# Patient Record
Sex: Female | Born: 1980 | Race: White | Hispanic: No | Marital: Married | Smoking: Former smoker
Health system: Southern US, Community
[De-identification: ages and names within clinical notes are randomized; demographics above are authoritative.]

## PROBLEM LIST (undated history)

## (undated) DIAGNOSIS — I10 Essential (primary) hypertension: Secondary | ICD-10-CM

## (undated) DIAGNOSIS — J45909 Unspecified asthma, uncomplicated: Secondary | ICD-10-CM

---

## 2004-09-14 ENCOUNTER — Ambulatory Visit: Payer: Self-pay

## 2004-11-16 ENCOUNTER — Ambulatory Visit: Payer: Self-pay

## 2005-03-04 ENCOUNTER — Observation Stay: Payer: Self-pay | Admitting: Obstetrics and Gynecology

## 2005-03-05 ENCOUNTER — Observation Stay: Payer: Self-pay | Admitting: Obstetrics and Gynecology

## 2005-03-05 ENCOUNTER — Inpatient Hospital Stay: Payer: Self-pay | Admitting: Obstetrics and Gynecology

## 2007-03-06 ENCOUNTER — Other Ambulatory Visit: Payer: Self-pay

## 2007-03-06 ENCOUNTER — Emergency Department: Payer: Self-pay | Admitting: Emergency Medicine

## 2007-03-08 ENCOUNTER — Emergency Department: Payer: Self-pay | Admitting: Emergency Medicine
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
## 2010-07-27 ENCOUNTER — Emergency Department: Payer: Self-pay | Admitting: Emergency Medicine

## 2010-08-19 ENCOUNTER — Encounter: Admission: RE | Admit: 2010-08-19 | Discharge: 2010-08-19 | Payer: Self-pay | Admitting: Diagnostic Neuroimaging

## 2010-11-07 ENCOUNTER — Emergency Department: Payer: Self-pay | Admitting: Emergency Medicine

## 2011-10-18 IMAGING — CR DG CHEST 2V
1 series · 2 of 2 positions shown · non-contrast
Comparison: none

REASON FOR EXAM: COUGH
COMMENTS:

PROCEDURE:     DXR - DXR CHEST PA (OR AP) AND LATERAL  - November 07, 2010  [DATE]
RESULT:     The lungs are clear. The cardiac silhouette and visualized bony
skeleton are unremarkable.

[Series 1: view not recorded · 0.17mm/px · 2 of 2 slices shown]
[im 1/2]
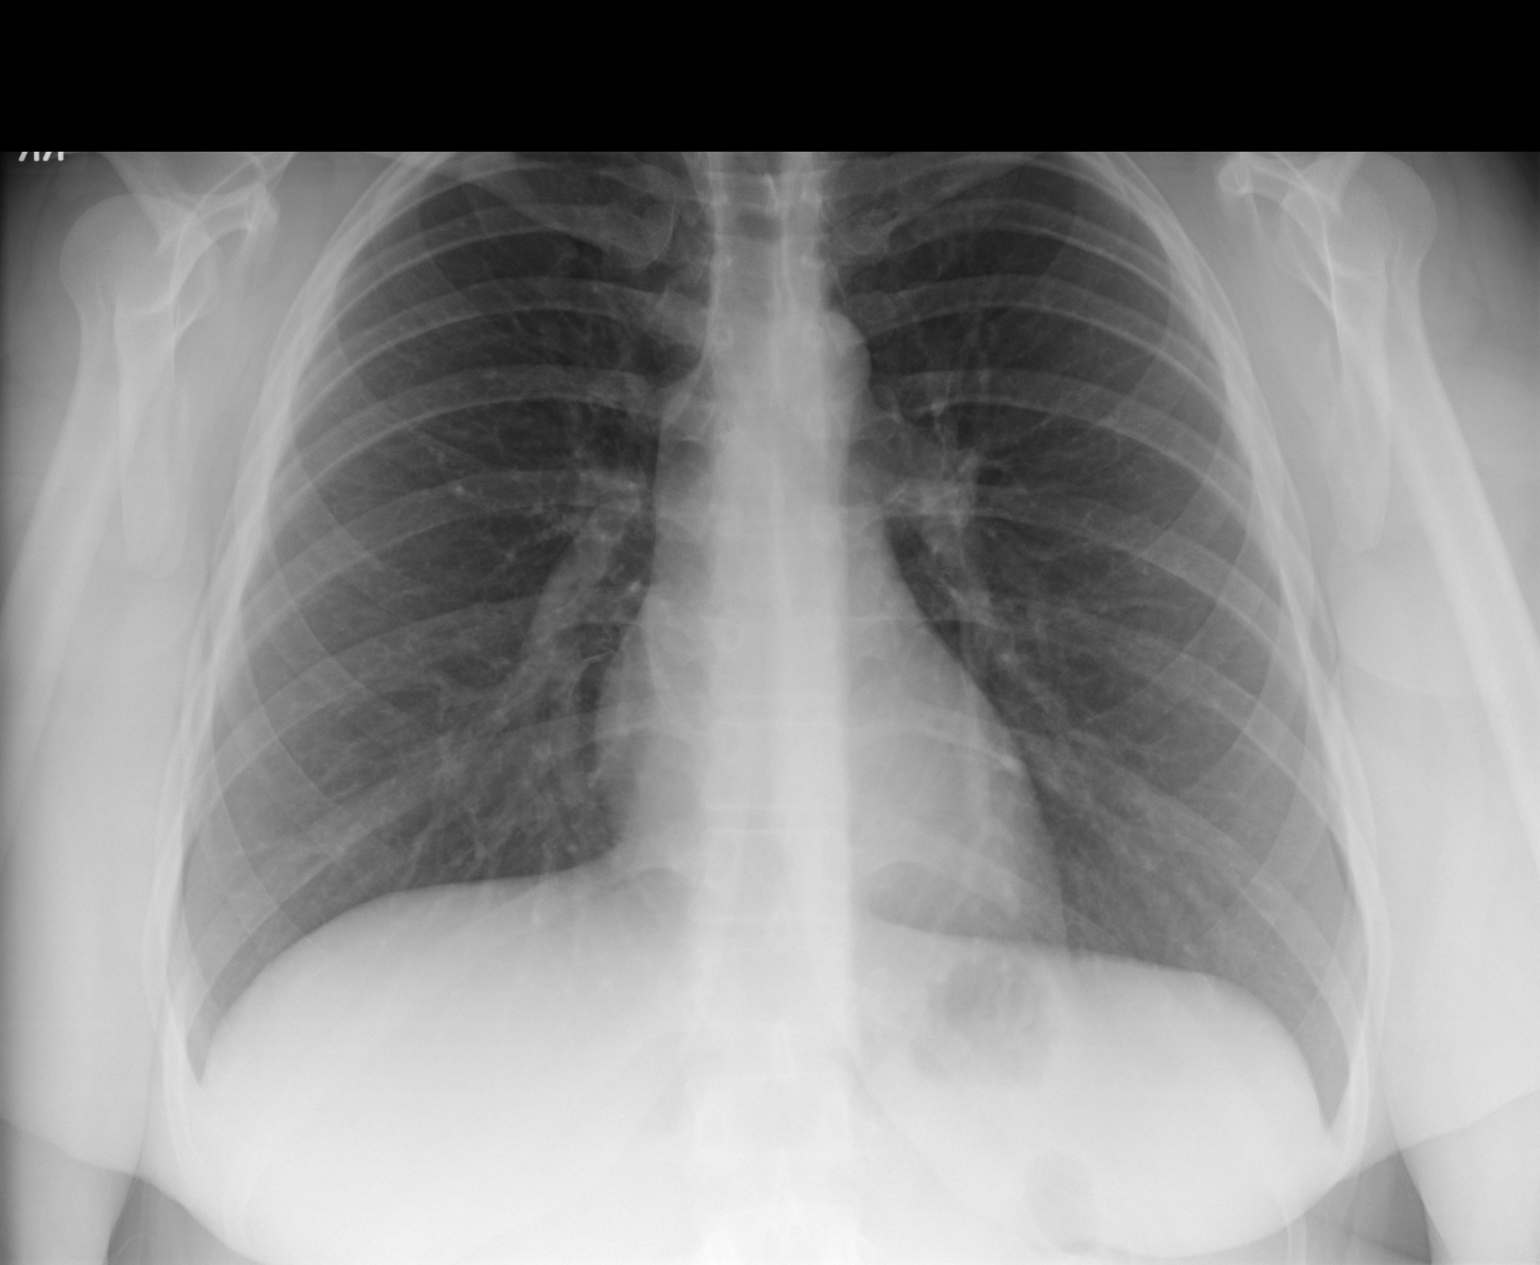
[im 2/2]
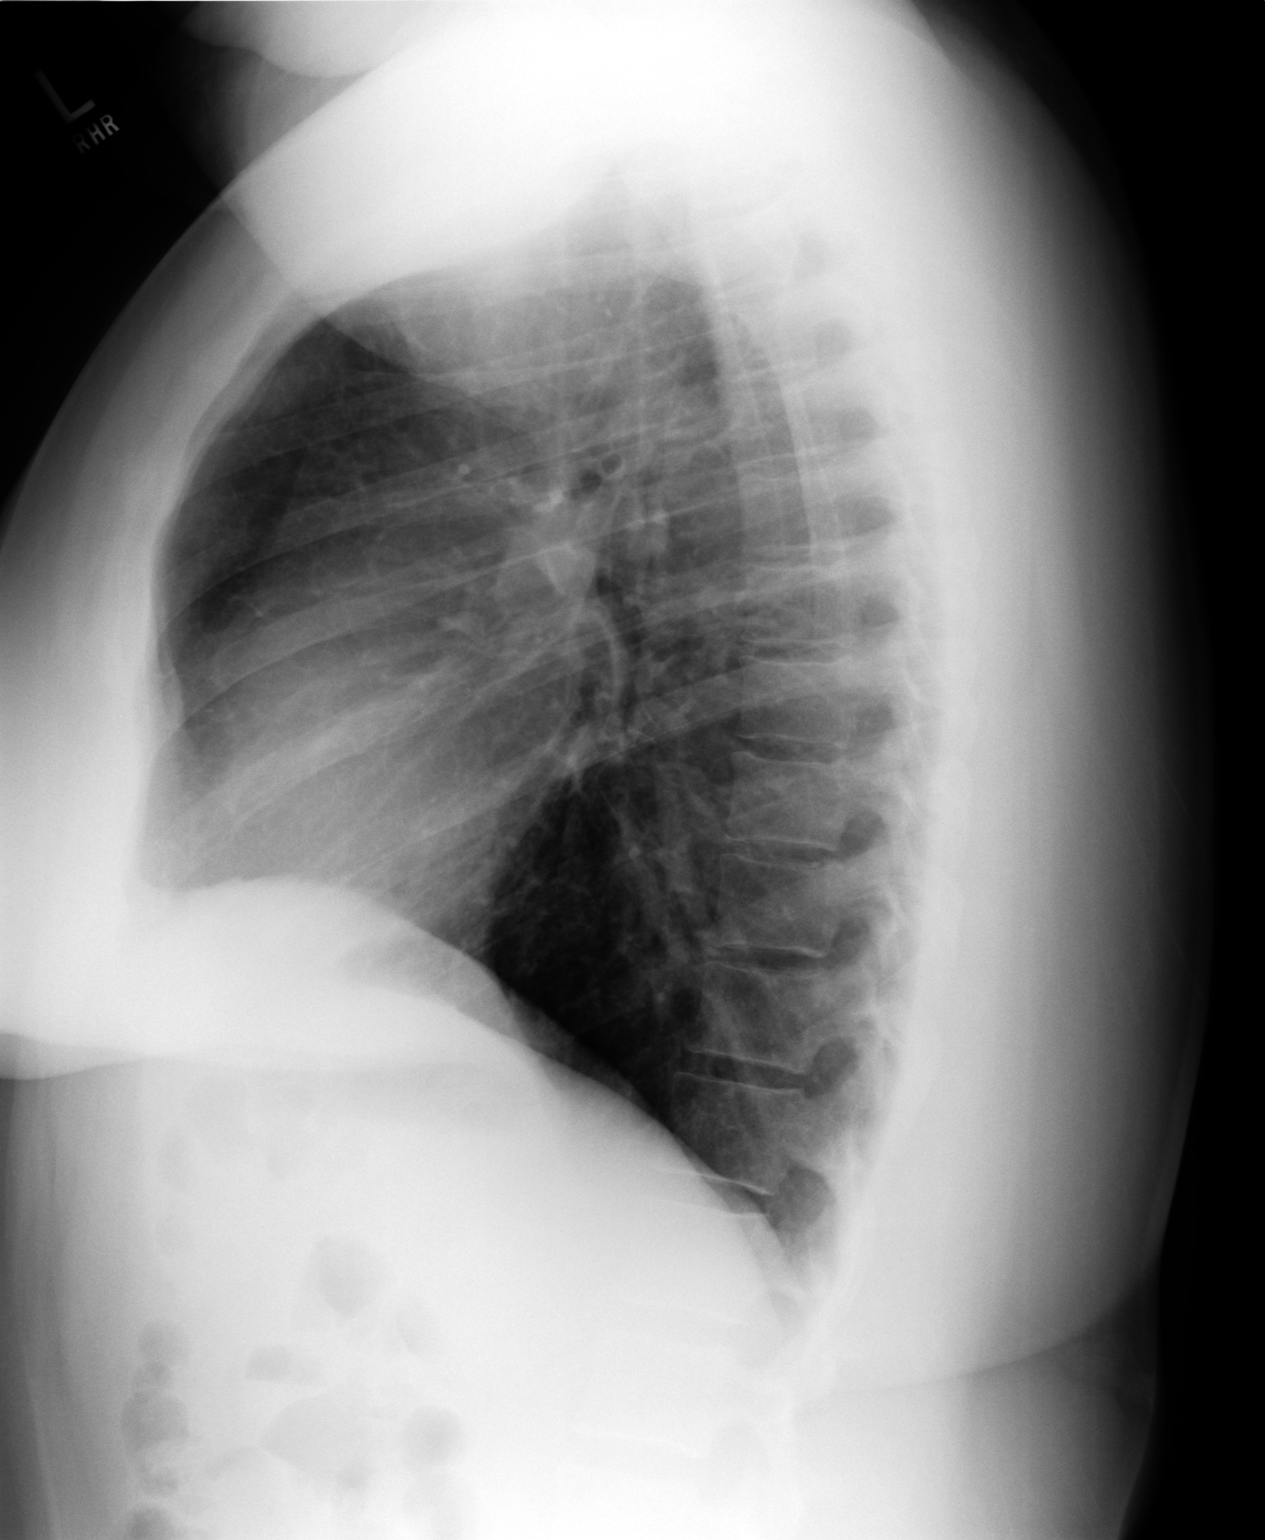

[2 of 2 positions shown; findings below may reference images not displayed]

IMPRESSION: 1. Chest radiograph without evidence of acute cardiopulmonary disease.
2. The study was compared to prior study dated 03/06/2007.

## 2016-10-16 DIAGNOSIS — J069 Acute upper respiratory infection, unspecified: Secondary | ICD-10-CM | POA: Insufficient documentation

## 2016-10-16 DIAGNOSIS — J45909 Unspecified asthma, uncomplicated: Secondary | ICD-10-CM | POA: Diagnosis not present

## 2016-10-16 DIAGNOSIS — I10 Essential (primary) hypertension: Secondary | ICD-10-CM | POA: Insufficient documentation

## 2016-10-16 DIAGNOSIS — Z87891 Personal history of nicotine dependence: Secondary | ICD-10-CM | POA: Insufficient documentation

## 2016-10-16 DIAGNOSIS — J029 Acute pharyngitis, unspecified: Secondary | ICD-10-CM | POA: Diagnosis present

## 2016-10-17 ENCOUNTER — Encounter: Payer: Self-pay | Admitting: Emergency Medicine

## 2016-10-17 ENCOUNTER — Emergency Department
Admission: EM | Admit: 2016-10-17 | Discharge: 2016-10-17 | Disposition: A | Discharge status: Home / Self Care | Payer: BLUE CROSS/BLUE SHIELD | Attending: Emergency Medicine | Admitting: Emergency Medicine

## 2016-10-17 DIAGNOSIS — J069 Acute upper respiratory infection, unspecified: Secondary | ICD-10-CM

## 2016-10-17 DIAGNOSIS — J029 Acute pharyngitis, unspecified: Secondary | ICD-10-CM

## 2016-10-17 HISTORY — DX: Essential (primary) hypertension: I10

## 2016-10-17 HISTORY — DX: Unspecified asthma, uncomplicated: J45.909

## 2016-10-17 LAB — POCT RAPID STREP A: STREPTOCOCCUS, GROUP A SCREEN (DIRECT): NEGATIVE

## 2016-10-17 MED ORDER — KETOROLAC TROMETHAMINE 60 MG/2ML IM SOLN
60.0000 mg | Freq: Once | INTRAMUSCULAR | Status: AC
  Administered 2016-10-17: 60 mg via INTRAMUSCULAR
  Filled 2016-10-17: qty 2

## 2016-10-17 MED ORDER — OXYMETAZOLINE HCL 0.05 % NA SOLN
1.0000 | Freq: Once | NASAL | Status: AC
  Administered 2016-10-17: 1 via NASAL
  Filled 2016-10-17: qty 15

## 2016-10-17 MED ORDER — DEXAMETHASONE 10 MG/ML FOR PEDIATRIC ORAL USE
10.0000 mg | Freq: Once | INTRAMUSCULAR | Status: AC
  Administered 2016-10-17: 10 mg via ORAL
  Filled 2016-10-17: qty 1

## 2016-10-17 NOTE — ED Provider Notes (Signed)
[facility] Emergency Department Provider Note   ____________________________________________   First MD Initiated Contact with Patient 10/17/16 0103     (approximate)  I have reviewed the triage vital signs and the nursing notes.   HISTORY  Chief Complaint Sore Throat    HPI Carol Clay is a 35 y.o. female who comes into the hospital today with some sore throat. [facility] and was diagnosed with a respiratory infection. She was given medications and reports that her cough is improved but her ear still hurts and she's had some developing sore throat. The patient reports that her throat pain is gotten worse throughout the day and she feels like she can hardly swallow. [facility] who suggested that she call her primary care physician. The patient's primary care physician recommended that she come to the ER to get a strep test. The patient reports that she has not checked herself for fever at home. She has been taking Tessalon Perles, Magic mouthwash and guaifenesin. The patient reports that she is not even swallowing in her sleep as she is been drooling. She tried using popsicles and cough drops but she reports that nothing is helping. The patient reports it hurts to swallow. She rates her pain a 7 out of 10 in intensity. The patient reports that she's had a cough since Monday as well as some burning to her nose. She reports though that the remaining symptoms developed over the last 24-48 hours. The patient and her family are concerned that she still having symptoms so she is here for evaluation.The patient has no chest pain, shortness of breath, headache, blurred vision, neck pain, abdominal pain, nausea, vomiting.   Past Medical History:  Diagnosis Date  . Asthma   . Hypertension    intercranial HTN    There are no active problems to display for this patient.   History reviewed. No pertinent surgical  history.  Prior to Admission medications   Not on File    Allergies Topamax [topiramate]  No family history on file.  Social History Social History  Substance Use Topics  . Smoking status: Former Smoker    Quit date: 10/03/2016  . Smokeless tobacco: Never Used  . Alcohol use No    Review of Systems Constitutional: No fever/chills Eyes: No visual changes. ENT:  sore throat, ear pain, nose burning and drainage Cardiovascular: Denies chest pain. Respiratory: Cough Gastrointestinal: No abdominal pain.  No nausea, no vomiting.  No diarrhea.  No constipation. Genitourinary: Negative for dysuria. Musculoskeletal: Negative for back pain. Skin: Negative for rash. Neurological: Negative for headaches, focal weakness or numbness.  10-point ROS otherwise negative.  ____________________________________________   PHYSICAL EXAM:  VITAL SIGNS: ED Triage Vitals  Enc Vitals Group     BP 10/17/16 0003 (!) 153/130     Pulse Rate 10/17/16 0003 (!) 107     Resp 10/17/16 0003 18     Temp 10/17/16 0003 98.2 F (36.8 C)     Temp Source 10/17/16 0003 Oral     SpO2 10/17/16 0003 98 %     Weight 10/17/16 0003 290 lb (131.5 kg)     Height 10/17/16 0003 5\' 5"  (1.651 m)     Head Circumference --      Peak Flow --      Pain Score 10/17/16 0004 7     Pain Loc --      Pain Edu? --  Excl. in GC? --     Constitutional: Alert and oriented. Well appearing and in Mild distress. Ears: Bilateral TMs with no effusion or erythema, no bulging Eyes: Conjunctivae are normal. PERRL. EOMI. Head: Atraumatic. Nose: No congestion/rhinnorhea. Mouth/Throat: Mucous membranes are moist.  Oropharynx non-erythematous. Cardiovascular: Normal rate, regular rhythm. Grossly normal heart sounds.  Good peripheral circulation. Respiratory: Normal respiratory effort.  No retractions. Lungs CTAB. Gastrointestinal: Soft and nontender. No distention. Active bowel sounds Musculoskeletal: No lower extremity  tenderness nor edema.   Neurologic:  Normal speech and language.  Skin:  Skin is warm, dry and intact.  Psychiatric: Mood and affect are normal.   ____________________________________________   LABS (all labs ordered are listed, but only abnormal results are displayed)  Labs Reviewed  POCT RAPID STREP A   ____________________________________________  EKG  none ____________________________________________  RADIOLOGY  none ____________________________________________   PROCEDURES  Procedure(s) performed: None  Procedures  Critical Care performed: No  ____________________________________________   INITIAL IMPRESSION / ASSESSMENT AND PLAN / ED COURSE  Pertinent labs & imaging results that were available during my care of the patient were reviewed by me and considered in my medical decision making (see chart for details).  This is a 35 year old female who comes into the hospital today with some sore throat. The patient is been having some cough as well as runny nose and ear pain and the sore throat has developed today and has been worse. She reports that the medication she has has not been helping at home. She has not taken any other medications. We did test the patient for strep throat and it was negative. I will give the patient a dose of Decadron as well as a shot of Toradol. The patient will also receive some Afrin to help with her ear pressure and congestion.  Patient has had no fevers and her symptoms are consistent with a viral upper respiratory infection. I will recommend having the patient follow back up with her primary care physician. The patient otherwise has no other concerns. She'll be discharged home.  Clinical Course      ____________________________________________   FINAL CLINICAL IMPRESSION(S) / ED DIAGNOSES  Final diagnoses:  Viral pharyngitis  Viral upper respiratory tract infection      NEW MEDICATIONS STARTED DURING THIS VISIT:  There  are no discharge medications for this patient.    Note:  This document was prepared using Dragon voice recognition software and may include unintentional dictation errors.    Rebecka ApleyAllison P Djibril Glogowski, MD 10/17/16 [phone]

## 2016-10-17 NOTE — ED Notes (Signed)
Pt discharged to home.  Family member driving.  Discharge instructions reviewed.  Verbalized understanding.  No questions or concerns at this time.  Teach back verified.  Pt in NAD.  No items left in ED.   

## 2016-10-17 NOTE — ED Triage Notes (Signed)
Pt was [facility] Saturday [facility]. Pt was diagnosed with URI and given Benzonatate 100mg , Magic Mouthwash, and Guaifenesin-Codeine 5ml  which she says are not working. Pt is in NAD at this time.

## 2019-11-11 ENCOUNTER — Other Ambulatory Visit: Payer: Self-pay

## 2019-11-11 DIAGNOSIS — Z20822 Contact with and (suspected) exposure to covid-19: Secondary | ICD-10-CM

## 2019-11-12 LAB — NOVEL CORONAVIRUS, NAA: SARS-CoV-2, NAA: NOT DETECTED

## 2021-06-22 ENCOUNTER — Other Ambulatory Visit: Payer: Self-pay | Admitting: Orthopedic Surgery

## 2021-06-22 DIAGNOSIS — S83102D Unspecified subluxation of left knee, subsequent encounter: Secondary | ICD-10-CM

## 2021-07-08 ENCOUNTER — Ambulatory Visit
Admission: RE | Admit: 2021-07-08 | Discharge: 2021-07-08 | Disposition: A | Discharge status: Home / Self Care | Payer: BC Managed Care – PPO | Source: Ambulatory Visit | Attending: Orthopedic Surgery | Admitting: Orthopedic Surgery

## 2021-07-08 ENCOUNTER — Other Ambulatory Visit: Payer: Self-pay

## 2021-07-08 DIAGNOSIS — S83102D Unspecified subluxation of left knee, subsequent encounter: Secondary | ICD-10-CM

## 2021-07-19 ENCOUNTER — Ambulatory Visit
Admission: RE | Admit: 2021-07-19 | Discharge: 2021-07-19 | Disposition: A | Discharge status: Home / Self Care | Payer: BC Managed Care – PPO | Attending: Gastroenterology | Admitting: Gastroenterology

## 2021-07-19 ENCOUNTER — Ambulatory Visit: Payer: BC Managed Care – PPO | Admitting: Anesthesiology

## 2021-07-19 ENCOUNTER — Encounter: Payer: Self-pay | Admitting: Anesthesiology

## 2021-07-19 ENCOUNTER — Encounter
Admission: RE | Disposition: A | Discharge status: Home / Self Care | Payer: Self-pay | Source: Home / Self Care | Attending: Gastroenterology

## 2021-07-19 DIAGNOSIS — Z880 Allergy status to penicillin: Secondary | ICD-10-CM | POA: Diagnosis not present

## 2021-07-19 DIAGNOSIS — R1310 Dysphagia, unspecified: Secondary | ICD-10-CM | POA: Insufficient documentation

## 2021-07-19 DIAGNOSIS — E669 Obesity, unspecified: Secondary | ICD-10-CM | POA: Insufficient documentation

## 2021-07-19 DIAGNOSIS — Z79899 Other long term (current) drug therapy: Secondary | ICD-10-CM | POA: Insufficient documentation

## 2021-07-19 DIAGNOSIS — Z881 Allergy status to other antibiotic agents status: Secondary | ICD-10-CM | POA: Insufficient documentation

## 2021-07-19 DIAGNOSIS — Z87891 Personal history of nicotine dependence: Secondary | ICD-10-CM | POA: Insufficient documentation

## 2021-07-19 DIAGNOSIS — Z885 Allergy status to narcotic agent status: Secondary | ICD-10-CM | POA: Diagnosis not present

## 2021-07-19 DIAGNOSIS — Z6841 Body Mass Index (BMI) 40.0 and over, adult: Secondary | ICD-10-CM | POA: Diagnosis not present

## 2021-07-19 DIAGNOSIS — Z791 Long term (current) use of non-steroidal anti-inflammatories (NSAID): Secondary | ICD-10-CM | POA: Diagnosis not present

## 2021-07-19 DIAGNOSIS — K219 Gastro-esophageal reflux disease without esophagitis: Secondary | ICD-10-CM | POA: Insufficient documentation

## 2021-07-19 DIAGNOSIS — J45909 Unspecified asthma, uncomplicated: Secondary | ICD-10-CM | POA: Diagnosis not present

## 2021-07-19 HISTORY — PX: ESOPHAGOGASTRODUODENOSCOPY (EGD) WITH PROPOFOL: SHX5813

## 2021-07-19 LAB — POCT PREGNANCY, URINE: Preg Test, Ur: NEGATIVE

## 2021-07-19 SURGERY — ESOPHAGOGASTRODUODENOSCOPY (EGD) WITH PROPOFOL
Anesthesia: General

## 2021-07-19 MED ORDER — LIDOCAINE HCL (CARDIAC) PF 100 MG/5ML IV SOSY
PREFILLED_SYRINGE | INTRAVENOUS | Status: DC | PRN
  Administered 2021-07-19: 100 mg via INTRAVENOUS

## 2021-07-19 MED ORDER — PROPOFOL 500 MG/50ML IV EMUL
INTRAVENOUS | Status: AC
  Filled 2021-07-19: qty 50

## 2021-07-19 MED ORDER — LIDOCAINE HCL (PF) 2 % IJ SOLN
INTRAMUSCULAR | Status: AC
  Filled 2021-07-19: qty 5

## 2021-07-19 MED ORDER — PROPOFOL 500 MG/50ML IV EMUL
INTRAVENOUS | Status: DC | PRN
  Administered 2021-07-19: 150 ug/kg/min via INTRAVENOUS

## 2021-07-19 MED ORDER — SODIUM CHLORIDE 0.9 % IV SOLN
INTRAVENOUS | Status: DC
  Administered 2021-07-19: 1000 mL via INTRAVENOUS

## 2021-07-19 NOTE — Interval H&P Note (Signed)
History and Physical Interval Note:  07/19/2021 10:37 AM  Carol Clay  has presented today for surgery, with the diagnosis of dysphagia r13.10, gerd k21.9.  The various methods of treatment have been discussed with the patient and family. After consideration of risks, benefits and other options for treatment, the patient has consented to  Procedure(s): ESOPHAGOGASTRODUODENOSCOPY (EGD) WITH PROPOFOL (N/A) as a surgical intervention.  The patient's history has been reviewed, patient examined, no change in status, stable for surgery.  I have reviewed the patient's chart and labs.  Questions were answered to the patient's satisfaction.     Regis Bill  Ok to proceed with EGD

## 2021-07-19 NOTE — Anesthesia Postprocedure Evaluation (Signed)
Anesthesia Post Note  Patient: EUGENE ZEIDERS  Procedure(s) Performed: ESOPHAGOGASTRODUODENOSCOPY (EGD) WITH PROPOFOL  Patient location during evaluation: Phase II Anesthesia Type: General Level of consciousness: awake and alert, awake and oriented Pain management: pain level controlled Vital Signs Assessment: post-procedure vital signs reviewed and stable Respiratory status: spontaneous breathing, nonlabored ventilation and respiratory function stable Cardiovascular status: blood pressure returned to baseline and stable Postop Assessment: no apparent nausea or vomiting Anesthetic complications: no   No notable events documented.   Last Vitals:  Vitals:   07/19/21 1120 07/19/21 1130  BP: 130/84 118/83  Pulse: 68 65  Resp: 17 13  Temp:    SpO2: 95% 97%    Last Pain:  Vitals:   07/19/21 1050  TempSrc: Temporal  PainSc:                  Manfred Arch

## 2021-07-19 NOTE — H&P (Signed)
Outpatient short stay form Pre-procedure 07/19/2021  Regis Bill, MD  Primary Physician: Phillips Hay, DO  Reason for visit:  Dysphagia  History of present illness:   40 y/o lady with history of obesity and GERD here for EGD for mild dysphagia symptoms. GERD well controlled as long as she takes PPI. No blood thinners. No family history of GI malignancies. Has history of asthma.    Current Facility-Administered Medications:    0.9 %  sodium chloride infusion, , Intravenous, Continuous, Aahana Elza, Rossie Muskrat, MD, Last Rate: 20 mL/hr at 07/19/21 1023, 1,000 mL at 07/19/21 1023  Medications Prior to Admission  Medication Sig Dispense Refill Last Dose   diclofenac (VOLTAREN) 75 MG EC tablet Take 75 mg by mouth 2 (two) times daily.      escitalopram (LEXAPRO) 20 MG tablet Take 20 mg by mouth daily.      metoprolol succinate (TOPROL-XL) 50 MG 24 hr tablet Take 50 mg by mouth daily. Take with or immediately following a meal.      pantoprazole (PROTONIX) 40 MG tablet Take 40 mg by mouth daily.        Allergies  Allergen Reactions   Topamax [Topiramate] Anaphylaxis   Clindamycin    Codeine    Penicillins      Past Medical History:  Diagnosis Date   Asthma    Hypertension    intercranial HTN    Review of systems:  Otherwise negative.    Physical Exam  Gen: Alert, oriented. Appears stated age.  HEENT: PERRLA. Lungs: No respiratory distress CV: RRR Abd: soft, benign, no masses Ext: No edema    Planned procedures: Proceed with EGD. The patient understands the nature of the planned procedure, indications, risks, alternatives and potential complications including but not limited to bleeding, infection, perforation, damage to internal organs and possible oversedation/side effects from anesthesia. The patient agrees and gives consent to proceed.  Please refer to procedure notes for findings, recommendations and patient disposition/instructions.     Regis Bill,  MD [facility]

## 2021-07-19 NOTE — Anesthesia Procedure Notes (Signed)
Date/Time: 07/19/2021 10:50 AM Performed by: Tonia Ghent Pre-anesthesia Checklist: Patient identified, Emergency Drugs available, Suction available, Patient being monitored and Timeout performed Patient Re-evaluated:Patient Re-evaluated prior to induction Oxygen Delivery Method: Supernova nasal CPAP Preoxygenation: Pre-oxygenation with 100% oxygen Induction Type: IV induction Airway Equipment and Method: Bite block Placement Confirmation: CO2 detector and breath sounds checked- equal and bilateral

## 2021-07-19 NOTE — Transfer of Care (Signed)
Immediate Anesthesia Transfer of Care Note  Patient: Carol Clay  Procedure(s) Performed: ESOPHAGOGASTRODUODENOSCOPY (EGD) WITH PROPOFOL  Patient Location: PACU  Anesthesia Type:General  Level of Consciousness: awake and sedated  Airway & Oxygen Therapy: Patient Spontanous Breathing and Patient connected to nasal cannula oxygen  Post-op Assessment: Report given to RN and Post -op Vital signs reviewed and stable  Post vital signs: Reviewed and stable  Last Vitals:  Vitals Value Taken Time  BP    Temp    Pulse    Resp    SpO2      Last Pain:  Vitals:   07/19/21 1005  TempSrc: Temporal  PainSc: 0-No pain         Complications: No notable events documented.

## 2021-07-19 NOTE — Op Note (Signed)
[facility] Gastroenterology Patient Name: Dynasia Kercheval Procedure Date: 07/19/2021 10:39 AM MRN: [id number] Account #: [id number] Date of Birth: 04/06/1981 Admit Type: Outpatient Age: 40 Room: [facility] ENDO ROOM 3 Gender: Female Note Status: Finalized Procedure:             Upper GI endoscopy Indications:           Dysphagia, Gastro-esophageal reflux disease Providers:             Andrey Farmer MD, MD Referring MD:          No Local Md, MD (Referring MD) Medicines:             Monitored Anesthesia Care Complications:         No immediate complications. Estimated blood loss:                         Minimal. Procedure:             Pre-Anesthesia Assessment:                        - Prior to the procedure, a History and Physical was                         performed, and patient medications and allergies were                         reviewed. The patient is competent. The risks and                         benefits of the procedure and the sedation options and                         risks were discussed with the patient. All questions                         were answered and informed consent was obtained.                         Patient identification and proposed procedure were                         verified by the physician, the nurse, the anesthetist                         and the technician in the endoscopy suite. Mental                         Status Examination: alert and oriented. Airway                         Examination: normal oropharyngeal airway and neck                         mobility. Respiratory Examination: clear to                         auscultation. CV Examination: normal. Prophylactic  Antibiotics: The patient does not require prophylactic                         antibiotics. Prior Anticoagulants: The patient has                         taken no previous anticoagulant or antiplatelet                         agents.  ASA Grade Assessment: III - A patient with                         severe systemic disease. After reviewing the risks and                         benefits, the patient was deemed in satisfactory                         condition to undergo the procedure. The anesthesia                         plan was to use monitored anesthesia care (MAC).                         Immediately prior to administration of medications,                         the patient was re-assessed for adequacy to receive                         sedatives. The heart rate, respiratory rate, oxygen                         saturations, blood pressure, adequacy of pulmonary                         ventilation, and response to care were monitored                         throughout the procedure. The physical status of the                         patient was re-assessed after the procedure.                        After obtaining informed consent, the endoscope was                         passed under direct vision. Throughout the procedure,                         the patient's blood pressure, pulse, and oxygen                         saturations were monitored continuously. The Endoscope                         was introduced through the mouth, and advanced to the  second part of duodenum. The upper GI endoscopy was                         accomplished without difficulty. The patient tolerated                         the procedure well. Findings:      No endoscopic abnormality was evident in the esophagus to explain the       patient's complaint of dysphagia. Biopsies were obtained from the       proximal and distal esophagus with cold forceps for histology of       suspected eosinophilic esophagitis. Estimated blood loss was minimal.      The entire examined stomach was normal. Biopsies were taken with a cold       forceps for Helicobacter pylori testing. Estimated blood loss was       minimal.       The examined duodenum was normal. Impression:            - No endoscopic esophageal abnormality to explain                         patient's dysphagia. Biopsied.                        - Normal stomach. Biopsied.                        - Normal examined duodenum. Recommendation:        - Discharge patient to home.                        - Resume previous diet.                        - Continue present medications.                        - Await pathology results.                        - Return to referring physician as previously                         scheduled. Procedure Code(s):     --- Professional ---                        [phone], Esophagogastroduodenoscopy, flexible,                         transoral; with biopsy, single or multiple Diagnosis Code(s):     --- Professional ---                        R13.10, Dysphagia, unspecified                        K21.9, Gastro-esophageal reflux disease without                         esophagitis CPT copyright 2019 American Medical Association. All rights reserved. The codes documented in this report are preliminary and upon coder  review may  be revised to meet current compliance requirements. Andrey Farmer MD, MD 07/19/2021 10:56:19 AM Number of Addenda: 0 Note Initiated On: 07/19/2021 10:39 AM Estimated Blood Loss:  Estimated blood loss was minimal.      [facility]

## 2021-07-19 NOTE — Anesthesia Preprocedure Evaluation (Signed)
Anesthesia Evaluation  Patient identified by MRN, date of birth, ID band Patient awake    Reviewed: Allergy & Precautions, NPO status , Patient's Chart, lab work & pertinent test results  Airway Mallampati: III  TM Distance: >3 FB Neck ROM: Full    Dental  (+) Missing, Poor Dentition, Chipped, Loose, Dental Advisory Given   Pulmonary asthma , former smoker,    Pulmonary exam normal        Cardiovascular hypertension, Pt. on medications and Pt. on home beta blockers negative cardio ROS Normal cardiovascular exam     Neuro/Psych Depression negative neurological ROS  negative psych ROS   GI/Hepatic Neg liver ROS, GERD  Medicated,  Endo/Other  diabetesMorbid obesity  Renal/GU negative Renal ROS  negative genitourinary   Musculoskeletal negative musculoskeletal ROS (+)   Abdominal   Peds negative pediatric ROS (+)  Hematology negative hematology ROS (+)   Anesthesia Other Findings . Pseudotumor cerebri   Reproductive/Obstetrics negative OB ROS                            Anesthesia Physical Anesthesia Plan  ASA: 3  Anesthesia Plan: General   Post-op Pain Management:    Induction: Intravenous  PONV Risk Score and Plan: 2 and Propofol infusion and TIVA  Airway Management Planned: Nasal Cannula  Additional Equipment:   Intra-op Plan:   Post-operative Plan:   Informed Consent: I have reviewed the patients History and Physical, chart, labs and discussed the procedure including the risks, benefits and alternatives for the proposed anesthesia with the patient or authorized representative who has indicated his/her understanding and acceptance.       Plan Discussed with: CRNA, Anesthesiologist and Surgeon  Anesthesia Plan Comments:         Anesthesia Quick Evaluation

## 2021-07-20 ENCOUNTER — Encounter: Payer: Self-pay | Admitting: Gastroenterology

## 2021-07-20 LAB — SURGICAL PATHOLOGY

## 2021-09-16 ENCOUNTER — Telehealth: Payer: BC Managed Care – PPO | Admitting: Physician Assistant

## 2021-09-16 DIAGNOSIS — J019 Acute sinusitis, unspecified: Secondary | ICD-10-CM

## 2021-09-16 DIAGNOSIS — J208 Acute bronchitis due to other specified organisms: Secondary | ICD-10-CM

## 2021-09-16 DIAGNOSIS — B9689 Other specified bacterial agents as the cause of diseases classified elsewhere: Secondary | ICD-10-CM

## 2021-09-16 MED ORDER — DOXYCYCLINE HYCLATE 100 MG PO TABS: 100.0000 mg | ORAL_TABLET | Freq: Two times a day (BID) | ORAL | 0 refills | Status: AC

## 2021-09-16 MED ORDER — PREDNISONE 20 MG PO TABS: 40.0000 mg | ORAL_TABLET | Freq: Every day | ORAL | 0 refills | Status: AC

## 2021-09-16 NOTE — Progress Notes (Signed)
Virtual Visit Consent   Carol Clay, you are scheduled for a virtual visit with a [facility] provider today.     Just as with appointments in the office, your consent must be obtained to participate.  Your consent will be active for this visit and any virtual visit you may have with one of our providers in the next 365 days.     If you have a MyChart account, a copy of this consent can be sent to you electronically.  All virtual visits are billed to your insurance company just like a traditional visit in the office.    As this is a virtual visit, video technology does not allow for your provider to perform a traditional examination.  This may limit your provider's ability to fully assess your condition.  If your provider identifies any concerns that need to be evaluated in person or the need to arrange testing (such as labs, EKG, etc.), we will make arrangements to do so.     Although advances in technology are sophisticated, we cannot ensure that it will always work on either your end or our end.  If the connection with a video visit is poor, the visit may have to be switched to a telephone visit.  With either a video or telephone visit, we are not always able to ensure that we have a secure connection.     I need to obtain your verbal consent now.   Are you willing to proceed with your visit today?    HALLY COLELLA has provided verbal consent on 09/16/2021 for a virtual visit (video or telephone).   Margaretann Loveless, PA-C   Date: 09/16/2021 3:22 PM   Virtual Visit via Video Note   I, Margaretann Loveless, connected with  Carol Clay  ([id number], [birth year]) on 09/16/21 at  3:00 PM EDT by a video-enabled telemedicine application and verified that I am speaking with the correct person using two identifiers.  Location: Patient: Mobile; parked safely in car Provider: Virtual Visit Location Provider: Home Office   I discussed the limitations of evaluation and management by  telemedicine and the availability of in person appointments. The patient expressed understanding and agreed to proceed.    History of Present Illness: Carol Clay is a 40 y.o. who identifies as a female who was assigned female at birth, and is being seen today for sinus symptoms and cough.  HPI: URI  This is a new problem. The current episode started 1 to 4 weeks ago (08/29/21; was on antibiotic for 2 broken teeth Amoxil 500 TID x 7 days). The problem has been gradually worsening. There has been no fever. Associated symptoms include congestion, coughing, headaches, rhinorrhea and sinus pain (left maxillary sinus is tender to touch). Pertinent negatives include no ear pain, plugged ear sensation, sore throat or wheezing. She has tried sleep, NSAIDs, acetaminophen and increased fluids for the symptoms. The treatment provided no relief.     Problems: There are no problems to display for this patient.   Allergies:  Allergies  Allergen Reactions   Topamax [Topiramate] Anaphylaxis   Clindamycin    Codeine    Penicillins    Medications:  Current Outpatient Medications:    doxycycline (VIBRA-TABS) 100 MG tablet, Take 1 tablet (100 mg total) by mouth 2 (two) times daily., Disp: 20 tablet, Rfl: 0   predniSONE (DELTASONE) 20 MG tablet, Take 2 tablets (40 mg total) by mouth daily with breakfast., Disp: 10 tablet, Rfl: 0  diclofenac (VOLTAREN) 75 MG EC tablet, Take 75 mg by mouth 2 (two) times daily., Disp: , Rfl:    escitalopram (LEXAPRO) 20 MG tablet, Take 20 mg by mouth daily., Disp: , Rfl:    metoprolol succinate (TOPROL-XL) 50 MG 24 hr tablet, Take 50 mg by mouth daily. Take with or immediately following a meal., Disp: , Rfl:    pantoprazole (PROTONIX) 40 MG tablet, Take 40 mg by mouth daily., Disp: , Rfl:   Observations/Objective: Patient is well-developed, well-nourished in no acute distress.  Resting comfortably in car parked safely Head is normocephalic, atraumatic.  No labored  breathing. Speech is clear and coherent with logical content.  Patient is alert and oriented at baseline.    Assessment and Plan: 1. Acute bacterial sinusitis - doxycycline (VIBRA-TABS) 100 MG tablet; Take 1 tablet (100 mg total) by mouth 2 (two) times daily.  Dispense: 20 tablet; Refill: 0 - predniSONE (DELTASONE) 20 MG tablet; Take 2 tablets (40 mg total) by mouth daily with breakfast.  Dispense: 10 tablet; Refill: 0  2. Acute bacterial bronchitis - doxycycline (VIBRA-TABS) 100 MG tablet; Take 1 tablet (100 mg total) by mouth 2 (two) times daily.  Dispense: 20 tablet; Refill: 0 - predniSONE (DELTASONE) 20 MG tablet; Take 2 tablets (40 mg total) by mouth daily with breakfast.  Dispense: 10 tablet; Refill: 0  - Worsening symptoms that have not responded to OTC medications or 7 days amoxil - Will give Doxycycline - Add prednisone for inflammation - Continue allergy medications.  - Stay well hydrated and get plenty of rest.  - Call or seek in person evaluation if no symptom improvement or if symptoms worsen.  Follow Up Instructions: I discussed the assessment and treatment plan with the patient. The patient was provided an opportunity to ask questions and all were answered. The patient agreed with the plan and demonstrated an understanding of the instructions.  A copy of instructions were sent to the patient via MyChart unless otherwise noted below.    The patient was advised to call back or seek an in-person evaluation if the symptoms worsen or if the condition fails to improve as anticipated.  Time:  I spent 12 minutes with the patient via telehealth technology discussing the above problems/concerns.    Margaretann Loveless, PA-C

## 2021-09-16 NOTE — Patient Instructions (Signed)
Dalia Heading, thank you for joining Margaretann Loveless, PA-C for today's virtual visit.  While this provider is not your primary care provider (PCP), if your PCP is located in our provider database this encounter information will be shared with them immediately following your visit.  Consent: (Patient) Carol Clay provided verbal consent for this virtual visit at the beginning of the encounter.  Current Medications:  Current Outpatient Medications:    doxycycline (VIBRA-TABS) 100 MG tablet, Take 1 tablet (100 mg total) by mouth 2 (two) times daily., Disp: 20 tablet, Rfl: 0   predniSONE (DELTASONE) 20 MG tablet, Take 2 tablets (40 mg total) by mouth daily with breakfast., Disp: 10 tablet, Rfl: 0   diclofenac (VOLTAREN) 75 MG EC tablet, Take 75 mg by mouth 2 (two) times daily., Disp: , Rfl:    escitalopram (LEXAPRO) 20 MG tablet, Take 20 mg by mouth daily., Disp: , Rfl:    metoprolol succinate (TOPROL-XL) 50 MG 24 hr tablet, Take 50 mg by mouth daily. Take with or immediately following a meal., Disp: , Rfl:    pantoprazole (PROTONIX) 40 MG tablet, Take 40 mg by mouth daily., Disp: , Rfl:    Medications ordered in this encounter:  Meds ordered this encounter  Medications   doxycycline (VIBRA-TABS) 100 MG tablet    Sig: Take 1 tablet (100 mg total) by mouth 2 (two) times daily.    Dispense:  20 tablet    Refill:  0    Order Specific Question:   Supervising Provider    Answer:   Hyacinth Meeker, BRIAN [[id number]]   predniSONE (DELTASONE) 20 MG tablet    Sig: Take 2 tablets (40 mg total) by mouth daily with breakfast.    Dispense:  10 tablet    Refill:  0    Order Specific Question:   Supervising Provider    Answer:   Hyacinth Meeker, BRIAN [[id number]]     *If you need refills on other medications prior to your next appointment, please contact your pharmacy*  Follow-Up: Call back or seek an in-person evaluation if the symptoms worsen or if the condition fails to improve as anticipated.  Other  Instructions Sinusitis, Adult Sinusitis is soreness and swelling (inflammation) of your sinuses. Sinuses are hollow spaces in the bones around your face. They are located: Around your eyes. In the middle of your forehead. Behind your nose. In your cheekbones. Your sinuses and nasal passages are lined with a fluid called mucus. Mucus drains out of your sinuses. Swelling can trap mucus in your sinuses. This lets germs (bacteria, virus, or fungus) grow, which leads to infection. Most of the time, this condition is caused by a virus. What are the causes? This condition is caused by: Allergies. Asthma. Germs. Things that block your nose or sinuses. Growths in the nose (nasal polyps). Chemicals or irritants in the air. Fungus (rare). What increases the risk? You are more likely to develop this condition if: You have a weak body defense system (immune system). You do a lot of swimming or diving. You use nasal sprays too much. You smoke. What are the signs or symptoms? The main symptoms of this condition are pain and a feeling of pressure around the sinuses. Other symptoms include: Stuffy nose (congestion). Runny nose (drainage). Swelling and warmth in the sinuses. Headache. Toothache. A cough that may get worse at night. Mucus that collects in the throat or the back of the nose (postnasal drip). Being unable to smell and taste. Being very tired (  fatigue). A fever. Sore throat. Bad breath. How is this diagnosed? This condition is diagnosed based on: Your symptoms. Your medical history. A physical exam. Tests to find out if your condition is short-term (acute) or long-term (chronic). Your doctor may: Check your nose for growths (polyps). Check your sinuses using a tool that has a light (endoscope). Check for allergies or germs. Do imaging tests, such as an MRI or CT scan. How is this treated? Treatment for this condition depends on the cause and whether it is short-term or  long-term. If caused by a virus, your symptoms should go away on their own within 10 days. You may be given medicines to relieve symptoms. They include: Medicines that shrink swollen tissue in the nose. Medicines that treat allergies (antihistamines). A spray that treats swelling of the nostrils.  Rinses that help get rid of thick mucus in your nose (nasal saline washes). If caused by bacteria, your doctor may wait to see if you will get better without treatment. You may be given antibiotic medicine if you have: A very bad infection. A weak body defense system. If caused by growths in the nose, you may need to have surgery. Follow these instructions at home: Medicines Take, use, or apply over-the-counter and prescription medicines only as told by your doctor. These may include nasal sprays. If you were prescribed an antibiotic medicine, take it as told by your doctor. Do not stop taking the antibiotic even if you start to feel better. Hydrate and humidify  Drink enough water to keep your pee (urine) pale yellow. Use a cool mist humidifier to keep the humidity level in your home above 50%. Breathe in steam for 10-15 minutes, 3-4 times a day, or as told by your doctor. You can do this in the bathroom while a hot shower is running. Try not to spend time in cool or dry air. Rest Rest as much as you can. Sleep with your head raised (elevated). Make sure you get enough sleep each night. General instructions  Put a warm, moist washcloth on your face 3-4 times a day, or as often as told by your doctor. This will help with discomfort. Wash your hands often with soap and water. If there is no soap and water, use hand sanitizer. Do not smoke. Avoid being around people who are smoking (secondhand smoke). Keep all follow-up visits as told by your doctor. This is important. Contact a doctor if: You have a fever. Your symptoms get worse. Your symptoms do not get better within 10 days. Get help  right away if: You have a very bad headache. You cannot stop throwing up (vomiting). You have very bad pain or swelling around your face or eyes. You have trouble seeing. You feel confused. Your neck is stiff. You have trouble breathing. Summary Sinusitis is swelling of your sinuses. Sinuses are hollow spaces in the bones around your face. This condition is caused by tissues in your nose that become inflamed or swollen. This traps germs. These can lead to infection. If you were prescribed an antibiotic medicine, take it as told by your doctor. Do not stop taking it even if you start to feel better. Keep all follow-up visits as told by your doctor. This is important. This information is not intended to replace advice given to you by your health care provider. Make sure you discuss any questions you have with your health care provider. Document Revised: 04/23/2018 Document Reviewed: 04/23/2018 Elsevier Patient Education  2022 [employer].  If you have been instructed to have an in-person evaluation today at a local Urgent Care facility, please use the link below. It will take you to a list of all of our available [facility], including address, phone number and hours of operation. Please do not delay care.  [facility]  If you or a family member do not have a primary care provider, use the link below to schedule a visit and establish care. When you choose a [facility] primary care physician or advanced practice provider, you gain a long-term partner in health. Find a Primary Care Provider  Learn more about 's in-office and virtual care options: [facility]
